# Patient Record
Sex: Male | Born: 2000 | Hispanic: Yes | Marital: Single | Smoking: Never smoker
Health system: Southern US, Community
[De-identification: ages and names within clinical notes are randomized; demographics above are authoritative.]

## PROBLEM LIST (undated history)

## (undated) DIAGNOSIS — L309 Dermatitis, unspecified: Secondary | ICD-10-CM

## (undated) HISTORY — PX: TONSILLECTOMY: SUR1361

## (undated) HISTORY — DX: Dermatitis, unspecified: L30.9

---
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
## 2023-11-14 ENCOUNTER — Ambulatory Visit: Payer: Medicaid Other | Admitting: Podiatry

## 2023-11-24 ENCOUNTER — Ambulatory Visit: Payer: Medicaid Other | Admitting: Podiatry

## 2023-11-24 ENCOUNTER — Encounter: Payer: Self-pay | Admitting: Podiatry

## 2023-11-24 ENCOUNTER — Ambulatory Visit (INDEPENDENT_AMBULATORY_CARE_PROVIDER_SITE_OTHER): Payer: Medicaid Other

## 2023-11-24 DIAGNOSIS — S93492A Sprain of other ligament of left ankle, initial encounter: Secondary | ICD-10-CM

## 2023-11-24 DIAGNOSIS — M25572 Pain in left ankle and joints of left foot: Secondary | ICD-10-CM

## 2023-11-24 DIAGNOSIS — M216X2 Other acquired deformities of left foot: Secondary | ICD-10-CM | POA: Diagnosis not present

## 2023-11-24 MED ORDER — MELOXICAM 15 MG PO TABS: 15.0000 mg | ORAL_TABLET | Freq: Every day | ORAL | 3 refills | Status: AC

## 2023-11-24 NOTE — Progress Notes (Signed)
  Subjective:  Patient ID: Hudson Quale, male    DOB: November 15, 2000,  MRN: [id number]  Chief Complaint  Patient presents with   Ankle Injury    Patient went to a party about 6 months ago and twisted his left ankle and the next morning it was swollen and purple. Patient is taking ibuprofen for pain    Discussed the use of AI scribe software for clinical note transcription with the patient, who gave verbal consent to proceed.  History of Present Illness   Riggins Cisek is a 23 year old male who presents with persistent ankle pain following a previous sprain.  He twisted his ankle a couple of months ago, resulting in significant swelling and bruising. At that time, he sought care at a urgent care facility where x-rays were performed, revealing no fractures. 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 The pain occurs after physical activity and is exacerbated by his work duties, which involve frequent use of ladders and scaffolding, often on uneven ground.  Currently, he experiences mild pain in the same area, particularly during work. No pain on the inside of the ankle and no new bruising or swelling. He has been using sneakers for work, which provide minimal ankle support.          Objective:    Physical Exam   MUSCULOSKELETAL: Palpable pulse, foot warm, pain on the anterior talofibular ligament (ATFL), no pain on the calcaneofibular ligament (CFL) or posterior talofibular ligament (PTFL), no pain on the syndesmosis or proximal fibula, no pain on the deltoid ligament. Good range of motion of the ankle joint. Pain with anterior drawer test, some laxity noted. CFL appears intact       No images are attached to the encounter.    Results   RADIOLOGY Ankle X-ray: No acute fracture. Some diastasis of the syndesmosis. Ankle  joint mortise maintained. No notable OCD lesion. (11/24/2023)      Assessment:   1. Sprain of anterior talofibular ligament of left ankle, initial encounter      Plan:  Patient was evaluated and treated and all questions answered.  Assessment and Plan    Ankle Pain History of ankle sprain with persistent pain, particularly during work. Physical examination reveals pain with anterior drawer test and mild laxity, suggesting possible ligamentous injury of ATFL. X-ray shows no acute fracture, but some diastasis of the syndesmosis. -Continue use of ankle brace during work. -Start Meloxicam  for inflammation. -Initiate physical therapy for orthopedic rehab. -Perform home exercises as provided. -Consider high-top work boots for additional support. -Follow-up in 1 month. If no improvement, consider MRI and potential surgical options.          Return if symptoms worsen or fail to improve.

## 2023-11-24 NOTE — Patient Instructions (Addendum)
 Call to schedule physical therapy:  Physical Therapy and Orthopedic Rehabilitation at [facility] [address] [place]  [phone]   VISIT SUMMARY:  Blake Baker, a 23 year old male, visited today due to persistent ankle pain following a previous sprain. 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 Physical examination and x-rays suggest a possible ligament injury.  YOUR PLAN:  -ANKLE PAIN: Ankle pain can result from ligament injuries, which occur when the ligaments that support the ankle stretch or tear. You should continue using the ankle brace during work, start taking Meloxicam  to reduce inflammation, and begin physical therapy for rehabilitation. Perform the home exercises provided and consider wearing high-top work boots for better support. We will follow up in one month to assess your progress. If there is no improvement, we may consider an MRI and potential surgical options.  INSTRUCTIONS:  Follow up in 1 month to assess progress. If there is no improvement, we may consider an MRI and potential surgical options.   Ankle Sprain   An ankle sprain is a stretch or tear in a ligament in the ankle. Ligaments are tissues that connect bones to each other. The two most common types of ankle sprains are: Inversion sprain. This happens when the foot turns inward and the ankle rolls outward. It affects the ligament on the outside of the foot (lateral ligament). Eversion sprain. This happens when the foot turns outward and the ankle rolls inward. It affects the ligament on the inner side of the foot (medial ligament). What are the causes? This condition is often caused by accidentally rolling or twisting the ankle. What increases the risk? You are more likely to develop this condition if you play sports. What are the signs or symptoms?  Symptoms of this condition  include: Pain in your ankle. Swelling. Bruising. This may develop right after you sprain your ankle or 1-2 days later. Trouble standing or walking, especially when you turn or change directions. How is this diagnosed? This condition is diagnosed with: A physical exam. During the exam, your health care provider will press on certain parts of your foot and ankle and try to move them in certain ways. X-ray imaging. These may be taken to see how severe the sprain is and to check for broken bones. How is this treated? This condition may be treated with: A brace or splint. This is used to keep the ankle from moving until it heals. An elastic bandage. This is used to support the ankle. Crutches. Pain medicine. Surgery. This may be needed if the sprain is severe. Physical therapy. This may help to improve the range of motion in the ankle. Follow these instructions at home: If you have a brace or a splint: Wear the brace or splint as told by your health care provider. Remove it only as told by your health care provider. Loosen the brace or splint if your toes tingle, become numb, or turn cold and blue. Keep the brace or splint clean. If the brace or splint is not waterproof: Do not let it get wet. Cover it with a watertight covering when you take a bath or a shower. If you have an elastic bandage (dressing): Remove it to shower or bathe. Try not to move your ankle much, but wiggle your toes from time to time. This helps to prevent swelling. Adjust the dressing to make it more comfortable if it feels too tight. Loosen the dressing  if you have numbness or tingling in your foot, or if your foot becomes cold and blue. Managing pain, stiffness, and swelling   Take over-the-counter and prescription medicines only as told by your health care provider. For 2-3 days, keep your ankle raised (elevated) above the level of your heart as much as possible. If directed, put ice on the injured area: If you  have a removable brace or splint, remove it as told by your health care provider. Put ice in a plastic bag. Place a towel between your skin and the bag. Leave the ice on for 20 minutes, 2-3 times a day. General instructions Rest your ankle. Do not use the injured limb to support your body weight until your health care provider says that you can. Use crutches as told by your health care provider. Do not use any products that contain nicotine or tobacco, such as cigarettes, e-cigarettes, and chewing tobacco. If you need help quitting, ask your health care provider. Keep all follow-up visits as told by your health care provider. This is important. Contact a health care provider if: You have rapidly increasing bruising or swelling. Your pain is not relieved with medicine. Get help right away if: Your foot or toes become numb or blue. You have severe pain that gets worse. Summary An ankle sprain is a stretch or tear in a ligament in the ankle. Ligaments are tissues that connect bones to each other. This condition is often caused by accidentally rolling or twisting the ankle. Symptoms include pain, swelling, bruising, and trouble walking. To relieve pain and swelling, put ice on the affected ankle, raise your ankle above the level of your heart, and use an elastic bandage. Keep all follow-up visits as told by your health care provider. This is important. This information is not intended to replace advice given to you by your health care provider. Make sure you discuss any questions you have with your health care provider. Document Revised: 06/26/2018 Document Reviewed: 02/28/2018 Elsevier Patient Education  2020 [employer]  Ankle Sprain, Phase I Rehab An ankle sprain is an injury to the ligaments of your ankle. Ankle sprains cause stiffness, loss of motion, and loss of strength. Ask your health care provider which exercises are safe for you. Do exercises exactly as told by your health care  provider and adjust them as directed. It is normal to feel mild stretching, pulling, tightness, or discomfort as you do these exercises. Stop right away if you feel sudden pain or your pain gets worse. Do not begin these exercises until told by your health care provider. Stretching and range-of-motion exercises These exercises warm up your muscles and joints and improve the movement and flexibility of your lower leg and ankle. These exercises also help to relieve pain and stiffness. Gastroc and soleus stretch  This exercise is also called a calf stretch. It stretches the muscles in the back of the lower leg. These muscles are the gastrocnemius, or gastroc, and the soleus. Sit on the floor with your left / right leg extended. Loop a belt or towel around the ball of your left / right foot. The ball of your foot is on the walking surface, right under your toes. Keep your left / right ankle and foot relaxed and keep your knee straight while you use the belt or towel to pull your foot toward you. You should feel a gentle stretch behind your calf or knee in your gastroc muscle. Hold this position for 15 seconds, then release  to the starting position. Repeat the exercise with your knee bent. You can put a pillow or a rolled bath towel under your knee to support it. You should feel a stretch deep in your calf in the soleus muscle or at your Achilles tendon. Repeat 5 times. Complete this exercise 2 times a day. Ankle alphabet   Sit with your left / right leg supported at the lower leg. Do not rest your foot on anything. Make sure your foot has room to move freely. Think of your left / right foot as a paintbrush. Move your foot to trace each letter of the alphabet in the air. Keep your hip and knee still while you trace. Make the letters as large as you can without feeling discomfort. Trace every letter from A to Z. Repeat 5 times. Complete this exercise 2 times a day. Strengthening exercises These  exercises build strength and endurance in your ankle and lower leg. Endurance is the ability to use your muscles for a long time, even after they get tired. Ankle dorsiflexion   Secure a rubber exercise band or tube to an object, such as a table leg, that will stay still when the band is pulled. Secure the other end around your left / right foot. Sit on the floor facing the object, with your left / right leg extended. The band or tube should be slightly tense when your foot is relaxed. Slowly bring your foot toward you, bringing the top of your foot toward your shin (dorsiflexion), and pulling the band tighter. Hold this position for 15 seconds. Slowly return your foot to the starting position. Repeat 5 times. Complete this exercise 2 times a day. Ankle plantar flexion   Sit on the floor with your left / right leg extended. Loop a rubber exercise tube or band around the ball of your left / right foot. The ball of your foot is on the walking surface, right under your toes. Hold the ends of the band or tube in your hands. The band or tube should be slightly tense when your foot is relaxed. Slowly point your foot and toes downward to tilt the top of your foot away from your shin (plantar flexion). Hold this position for 15 seconds. Slowly return your foot to the starting position. Repeat 5 times. Complete this exercise 2times a day. Ankle eversion Sit on the floor with your legs straight out in front of you. Loop a rubber exercise band or tube around the ball of your left / right foot. The ball of your foot is on the walking surface, right under your toes. Hold the ends of the band in your hands, or secure the band to a stable object. The band or tube should be slightly tense when your foot is relaxed. Slowly push your foot outward, away from your other leg (eversion). Hold this position for 15 seconds. Slowly return your foot to the starting position. Repeat 5 times. Complete this exercise  2 times a day. This information is not intended to replace advice given to you by your health care provider. Make sure you discuss any questions you have with your health care provider. Document Revised: 01/23/2019 Document Reviewed: 07/17/2018 Elsevier Patient Education  2020 [employer].

## 2023-12-05 NOTE — Therapy (Deleted)
 OUTPATIENT PHYSICAL THERAPY LOWER EXTREMITY EVALUATION   Patient Name: Blake Baker MRN: [id number] DOB:29-May-2001, 23 y.o., male Today's Date: 12/05/2023  END OF SESSION:   Past Medical History:  Diagnosis Date   Eczema    Past Surgical History:  Procedure Laterality Date   TONSILLECTOMY     There are no active problems to display for this patient.   PCP: Norm Salt, PA   REFERRING PROVIDER: Edwin Cap, DPM  REFERRING DIAG: [id number] (ICD-10-CM) - Sprain of anterior talofibular ligament of left ankle, initial encounter  THERAPY DIAG:  No diagnosis found.  Rationale for Evaluation and Treatment: Rehabilitation  ONSET DATE: 6 months  SUBJECTIVE:   SUBJECTIVE STATEMENT: Blake Baker, a 23 year old male, visited today due to persistent ankle pain following a previous sprain. 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 Physical examination and x-rays suggest a possible ligament injury.  PERTINENT HISTORY: Blake Baker is a 23 year old male who presents with persistent ankle pain following a previous sprain.   He twisted his ankle a couple of months ago, resulting in significant swelling and bruising. At that time, he sought care at a urgent care facility where x-rays were performed, revealing no fractures. 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 The pain occurs after physical activity and is exacerbated by his work duties, which involve frequent use of ladders and scaffolding, often on uneven ground.   Currently, he experiences mild pain in the same area, particularly during work. No pain on the inside of the ankle and no new bruising or swelling. He has been using sneakers for  work, which provide minimal ankle support.  PAIN:  Are you having pain? {OPRCPAIN:27236}  PRECAUTIONS: None  RED FLAGS: None   WEIGHT BEARING RESTRICTIONS: No  FALLS:  Has patient fallen in last 6 months? No  OCCUPATION: ***  PLOF: Independent  PATIENT GOALS: ***  NEXT MD VISIT: TBD  OBJECTIVE:  Note: Objective measures were completed at Evaluation unless otherwise noted.  DIAGNOSTIC FINDINGS: RADIOLOGY Ankle X-ray: No acute fracture. Some diastasis of the syndesmosis. Ankle joint mortise maintained. No notable OCD lesion. (11/24/2023)   PATIENT SURVEYS:  LEFS ***  MUSCLE LENGTH: Hamstrings: Right *** deg; Left *** deg Maisie Fus test: Right *** deg; Left *** deg  POSTURE: {posture:25561}  PALPATION: ***  LOWER EXTREMITY ROM:  {AROM/PROM:27142} ROM Right eval Left eval  Hip flexion    Hip extension    Hip abduction    Hip adduction    Hip internal rotation    Hip external rotation    Knee flexion    Knee extension    Ankle dorsiflexion    Ankle plantarflexion    Ankle inversion    Ankle eversion     (Blank rows = not tested)  LOWER EXTREMITY MMT:  MMT Right eval Left eval  Hip flexion    Hip extension    Hip abduction    Hip adduction    Hip internal rotation    Hip external rotation    Knee flexion    Knee extension    Ankle dorsiflexion    Ankle plantarflexion    Ankle inversion    Ankle eversion     (Blank rows = not tested)  LOWER EXTREMITY SPECIAL  TESTS:  Ankle special tests: {ANKLE SPECIAL TESTS:26241}  FUNCTIONAL TESTS:  30 seconds chair stand test  GAIT: Distance walked: *** Assistive device utilized: {Assistive devices:23999} Level of assistance: {Levels of assistance:24026} Comments: ***                                                                                                                                TREATMENT DATE: ***    PATIENT EDUCATION:  Education details: Discussed eval findings, rehab rationale  and POC and patient is in agreement  Person educated: Patient Education method: Explanation Education comprehension: verbalized understanding and needs further education  HOME EXERCISE PROGRAM: ***  ASSESSMENT:  CLINICAL IMPRESSION: Patient is a *** y.o. *** who was seen today for physical therapy evaluation and treatment for ***.   OBJECTIVE IMPAIRMENTS: {opptimpairments:25111}.   ACTIVITY LIMITATIONS: {activitylimitations:27494}  PARTICIPATION LIMITATIONS: {participationrestrictions:25113}  PERSONAL FACTORS: {Personal factors:25162} are also affecting patient's functional outcome.   REHAB POTENTIAL: Good  CLINICAL DECISION MAKING: Stable/uncomplicated  EVALUATION COMPLEXITY: Low   GOALS: Goals reviewed with patient? No  SHORT TERM GOALS: Target date: *** *** Baseline: Goal status: INITIAL  2.  *** Baseline:  Goal status: INITIAL  3.  *** Baseline:  Goal status: INITIAL  4.  *** Baseline:  Goal status: INITIAL  5.  *** Baseline:  Goal status: INITIAL  6.  *** Baseline:  Goal status: INITIAL  LONG TERM GOALS: Target date: ***  *** Baseline:  Goal status: INITIAL  2.  *** Baseline:  Goal status: INITIAL  3.  *** Baseline:  Goal status: INITIAL  4.  *** Baseline:  Goal status: INITIAL  5.  *** Baseline:  Goal status: INITIAL  6.  *** Baseline:  Goal status: INITIAL   PLAN:  PT FREQUENCY: 1-2x/week  PT DURATION: 6 weeks  PLANNED INTERVENTIONS: 97164- PT Re-evaluation, 97110-Therapeutic exercises, 97530- Therapeutic activity, 97112- Neuromuscular re-education, 97535- Self Care, 04540- Manual therapy, [id number]- Gait training, Patient/Family education, Balance training, and Joint mobilization  PLAN FOR NEXT SESSION: HEP review and update, manual techniques as appropriate, aerobic tasks, ROM and flexibility activities, strengthening and PREs, TPDN, gait and balance training as needed     Hildred Laser, PT 12/05/2023, 4:11 PM

## 2023-12-06 ENCOUNTER — Ambulatory Visit: Payer: Medicaid Other | Attending: Podiatry

## 2023-12-26 ENCOUNTER — Ambulatory Visit (INDEPENDENT_AMBULATORY_CARE_PROVIDER_SITE_OTHER): Payer: Medicaid Other | Admitting: Podiatry

## 2023-12-26 DIAGNOSIS — Z91199 Patient's noncompliance with other medical treatment and regimen due to unspecified reason: Secondary | ICD-10-CM

## 2023-12-27 NOTE — Progress Notes (Signed)
 Patient was no-show for appointment today
